# Patient Record
Sex: Male | Born: 1955 | Race: Black or African American | Hispanic: No | Smoking: Former smoker
Health system: Southern US, Community
[De-identification: ages and names within clinical notes are randomized; demographics above are authoritative.]

## PROBLEM LIST (undated history)

## (undated) DIAGNOSIS — I10 Essential (primary) hypertension: Secondary | ICD-10-CM

## (undated) DIAGNOSIS — N02B9 Other recurrent and persistent immunoglobulin A nephropathy: Secondary | ICD-10-CM

## (undated) DIAGNOSIS — N028 Recurrent and persistent hematuria with other morphologic changes: Secondary | ICD-10-CM

## (undated) DIAGNOSIS — M352 Behcet's disease: Secondary | ICD-10-CM

## (undated) DIAGNOSIS — C801 Malignant (primary) neoplasm, unspecified: Secondary | ICD-10-CM

## (undated) HISTORY — PX: AMPUTATION FINGER: SHX6594

---

## 2007-05-31 ENCOUNTER — Emergency Department: Payer: Self-pay | Admitting: Emergency Medicine

## 2008-10-16 ENCOUNTER — Emergency Department: Payer: Self-pay | Admitting: Emergency Medicine

## 2011-01-20 ENCOUNTER — Emergency Department: Payer: Self-pay | Admitting: Emergency Medicine

## 2011-02-20 ENCOUNTER — Emergency Department: Payer: Self-pay | Admitting: Emergency Medicine

## 2011-03-20 ENCOUNTER — Emergency Department: Payer: Self-pay | Admitting: Emergency Medicine

## 2011-10-18 ENCOUNTER — Emergency Department: Payer: Self-pay | Admitting: Emergency Medicine

## 2011-10-28 ENCOUNTER — Emergency Department: Payer: Self-pay | Admitting: Emergency Medicine

## 2012-03-21 ENCOUNTER — Emergency Department: Payer: Self-pay | Admitting: Emergency Medicine

## 2012-05-16 ENCOUNTER — Emergency Department: Payer: Self-pay | Admitting: Emergency Medicine

## 2012-05-17 LAB — COMPREHENSIVE METABOLIC PANEL
Albumin: 3.2 g/dL — ABNORMAL LOW (ref 3.4–5.0)
Alkaline Phosphatase: 59 U/L (ref 50–136)
BUN: 14 mg/dL (ref 7–18)
Bilirubin,Total: 0.2 mg/dL (ref 0.2–1.0)
Calcium, Total: 8.9 mg/dL (ref 8.5–10.1)
Chloride: 107 mmol/L (ref 98–107)
Creatinine: 0.74 mg/dL (ref 0.60–1.30)
EGFR (Non-African Amer.): >60
Osmolality: 281 (ref 275–301)
Potassium: 3.4 mmol/L — ABNORMAL LOW (ref 3.5–5.1)
Sodium: 141 mmol/L (ref 136–145)
Total Protein: 7 g/dL (ref 6.4–8.2)

## 2012-05-17 LAB — URINALYSIS, COMPLETE
Bilirubin,UR: NEGATIVE
Blood: NEGATIVE
Ketone: NEGATIVE
Nitrite: NEGATIVE
Protein: NEGATIVE
Specific Gravity: 1.017 (ref 1.003–1.030)
Squamous Epithelial: NONE SEEN
WBC UR: 1 /HPF (ref 0–5)

## 2012-05-17 LAB — CBC
HGB: 13.9 g/dL (ref 13.0–18.0)
MCH: 32.1 pg (ref 26.0–34.0)
MCHC: 33.5 g/dL (ref 32.0–36.0)
RDW: 14.4 % (ref 11.5–14.5)

## 2012-05-17 LAB — TSH: Thyroid Stimulating Horm: 0.97 u[IU]/mL

## 2012-05-17 LAB — DRUG SCREEN, URINE
Amphetamines, Ur Screen: NEGATIVE (ref ?–1000)
Cannabinoid 50 Ng, Ur ~~LOC~~: NEGATIVE (ref ?–50)
MDMA (Ecstasy)Ur Screen: NEGATIVE (ref ?–500)
Methadone, Ur Screen: NEGATIVE (ref ?–300)
Opiate, Ur Screen: NEGATIVE (ref ?–300)
Phencyclidine (PCP) Ur S: NEGATIVE (ref ?–25)
Tricyclic, Ur Screen: NEGATIVE (ref ?–1000)

## 2012-05-17 LAB — CK TOTAL AND CKMB (NOT AT ARMC): CK-MB: 2.5 ng/mL (ref 0.5–3.6)

## 2012-05-17 LAB — ETHANOL: Ethanol %: <0.003 % (ref 0.000–0.080)

## 2014-05-02 ENCOUNTER — Observation Stay: Payer: Self-pay | Admitting: Internal Medicine

## 2014-05-02 LAB — CBC WITH DIFFERENTIAL/PLATELET
BASOS PCT: 0.9 %
Basophil #: 0.1 10*3/uL (ref 0.0–0.1)
Eosinophil #: 0.2 10*3/uL (ref 0.0–0.7)
Eosinophil %: 2.7 %
HCT: 44.3 % (ref 40.0–52.0)
HGB: 14.4 g/dL (ref 13.0–18.0)
LYMPHS PCT: 31.6 %
Lymphocyte #: 2.3 10*3/uL (ref 1.0–3.6)
MCH: 31.1 pg (ref 26.0–34.0)
MCHC: 32.5 g/dL (ref 32.0–36.0)
MCV: 96 fL (ref 80–100)
Monocyte #: 0.7 x10 3/mm (ref 0.2–1.0)
Monocyte %: 9.4 %
NEUTROS ABS: 4 10*3/uL (ref 1.4–6.5)
Neutrophil %: 55.4 %
Platelet: 376 10*3/uL (ref 150–440)
RBC: 4.62 10*6/uL (ref 4.40–5.90)
RDW: 14.1 % (ref 11.5–14.5)
WBC: 7.3 10*3/uL (ref 3.8–10.6)

## 2014-05-02 LAB — BASIC METABOLIC PANEL
Anion Gap: 6 — ABNORMAL LOW (ref 7–16)
BUN: 15 mg/dL (ref 7–18)
CALCIUM: 8.3 mg/dL — AB (ref 8.5–10.1)
CREATININE: 1.1 mg/dL (ref 0.60–1.30)
Chloride: 109 mmol/L — ABNORMAL HIGH (ref 98–107)
Co2: 26 mmol/L (ref 21–32)
EGFR (Non-African Amer.): >60
Glucose: 94 mg/dL (ref 65–99)
Osmolality: 282 (ref 275–301)
POTASSIUM: 3.8 mmol/L (ref 3.5–5.1)
SODIUM: 141 mmol/L (ref 136–145)

## 2014-05-02 LAB — PROTIME-INR
INR: 1
PROTHROMBIN TIME: 12.9 s (ref 11.5–14.7)

## 2014-05-07 LAB — CULTURE, BLOOD (SINGLE)

## 2014-08-02 ENCOUNTER — Encounter
Admit: 2014-08-02 | Disposition: A | Discharge status: Home / Self Care | Payer: Self-pay | Attending: Surgery | Admitting: Surgery

## 2014-08-13 NOTE — Discharge Summary (Signed)
PATIENT NAME:  Victor Wilkerson, Victor Wilkerson MR#:  [id number] DATE OF BIRTH:  May 04, 1955  DATE OF ADMISSION:  05/02/2014 DATE OF DISCHARGE:  05/03/2014  PRIMARY CARE PHYSICIAN: Marguerita Merles, MD   DISCHARGE DIAGNOSES: 1. 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 2. Hypertension.  3. Wegener disease.  4. Tobacco abuse.   CONDITION: Stable.   CODE STATUS: Full code.   HOME MEDICATIONS: Please refer to the medication reconciliation list.   DIET: Low-sodium diet.   ACTIVITY: As tolerated.   FOLLOWUP CARE: Follow up with PCP within 1-2 weeks.   REASON FOR ADMISSION: Toe pain.   HOSPITAL COURSE: The patient is a 59 year old African American gentleman with a history of Buerger disease and hypertension, who is in the ED with a left second toe pain for the past 2-3 days. For a detailed history and physical examination, please refer to the admission note dictated by Dr. Lavetta Nielsen. On admission date, the patient's laboratory data was unremarkable. Chest x-ray of the left foot reveals no acute abnormality. After admission, the patient got pain control with oxycodone. After admission, the patient had been treated with pain medication p.r.n.  Left second toe pain is better. The patient has no signs of infection or cellulitis. The patient has no complaints. Vital signs are stable. He is clinically stable and will be discharged to home today. I discussed the patient's discharge plan with the patient, nurse, and case manager.   TIME SPENT: About 35 minutes.    ____________________________ Demetrios Loll, MD qc:mw D: 05/03/2014 12:56:15 ET T: 05/03/2014 14:02:22 ET JOB#: [id number]  cc: Demetrios Loll, MD, <Dictator> Demetrios Loll MD ELECTRONICALLY SIGNED 05/03/2014 16:56

## 2014-08-13 NOTE — H&P (Signed)
PATIENT NAME:  Victor Wilkerson, Victor Wilkerson MR#:  [id number] DATE OF BIRTH:  10-20-1955  DATE OF ADMISSION:  05/02/2014  REFERRING PHYSICIAN: Luanna Cole. Ruffian, III, PA-C  PRIMARY CARE PHYSICIAN: Dr. Jannifer Franklin    CHIEF COMPLAINT: Toe pain.    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 Describes pain of the left second digit, which is worsening over the last 2 to 3 days' duration, describes it only as "pain," 10/10 intensity, no worsening or relieving factors, associated edema, erythema of the area and also an ulceration of the toe on the dorsal surface with some serosanguineous discharge. Denies any fevers, chills, or purulent drainage. Of note, ran out of pain medications about 4 days prior to arrival to the hospital.   REVIEW OF SYSTEMS:   CONSTITUTIONAL: Denies fevers, chills. Positive for fatigue, weakness.  EYES: Denies blurred vision, double vision, eye pain.  EARS, NOSE, THROAT: Denies tinnitus, ear pain, hearing loss.  RESPIRATORY:  Denies cough, wheeze, shortness of breath.    CARDIOVASCULAR: Denies chest pain, palpitations, edema.  GASTROINTESTINAL: Denies nausea, vomiting, diarrhea, abdominal pain.  GENITOURINARY: Denies dysuria or hematuria.  ENDOCRINE: Denies nocturia or thyroid problems. HEMATOLOGIC AND LYMPHATIC:  Denies easy bruising or bleeding.  SKIN: Denies rash, lesions aside from ulceration of the toe as described above.   MUSCULOSKELETAL: Denies pain in neck, back, shoulder, knees, hips or arthritic symptoms.  NEUROLOGIC: Denies paralysis, paresthesias.  PSYCHIATRIC: Denies anxiety or depressive symptoms.  Otherwise full review of systems performed by me is negative.     PAST MEDICAL HISTORY: Significant for hypertension as well as Wegener's and tobacco usage.   SOCIAL HISTORY: Positive  for everyday tobacco use. Denies any alcohol or drug use. Previously did use crack cocaine.   FAMILY HISTORY:  Denies any known cardiovascular or pulmonary disorders.   ALLERGIES: No known drug allergies.   HOME MEDICATIONS: Include aspirin 81 mg p.o. daily, oxycodone 10 mg p.o. q. 6 hours as needed for pain, clonidine 0.2 mg p.o. daily, hydrochlorothiazide/lisinopril 12.5/20 mg p.o. daily, amlodipine 5 mg p.o. daily, hydralazine 25 mg p.o. b.i.d.   PHYSICAL EXAMINATION:  VITAL SIGNS: Temperature 97.8, heart rate 70, respirations 18,  currently 158/85, saturating 98% room air, weight 61.2 kg, BMI of 21.8.  GENERAL:  African-American gentleman currently in no acute distress.  HEAD: Normocephalic, atraumatic.  EYES: Pupils equal, round, reactive to light. Extraocular muscles intact. No scleral icterus.  MOUTH: Moist mucosal membrane. Dentition poor. No abscess noted.  EAR, NOSE, THROAT: Clear without exudates. No external lesions.  NECK: Supple. No thyromegaly. No nodules. No JVD.  PULMONARY: Clear to auscultation bilaterally without wheeze, rales or rhonchi. No use of accessory muscles. Good respiratory effort.  CHEST: Nontender to palpation.  CARDIOVASCULAR: S1, S2, regular rate and rhythm. No murmurs, rubs or gallops. No edema. Pedal pulses diminished bilaterally. GASTROINTESTINAL: Soft, nontender, nondistended. No masses. Positive bowel sounds. No hepatosplenomegaly.  MUSCULOSKELETAL: No swelling, clubbing, or edema. Range of motion full in all extremities.  NEUROLOGIC: Cranial nerves II through XII intact. No gross focal neurological deficits. Sensation intact. Reflexes intact.  SKIN: Over the second left toe there is an ulceration on the dorsal surface about 1 x 1 cm with some serosanguineous discharge, nonpurulent discharge noted with some scant surrounding erythema.   PSYCHIATRIC: Mood and affect within normal limits. The patient is awake,  alert and oriented x 3. Insight, judgment  intact.   LABORATORY DATA: Sodium 141, potassium 3.8, chloride 109, bicarbonate 26, BUN 15, creatinine 1.10, glucose 94, WBC 7.3, hemoglobin 14.4, platelets of 376. X-ray of the left foot reveals no acute abnormality.   ASSESSMENT AND PLAN: A 59 year old African-American gentleman with history of Wegener's and hypertension presenting with left-sided toe pain unrelieved despite receiving medications.  1.  Intractable pain secondary to buegers and ulceration.  Provide with pain medications.  case discused vascular surgery in the Emergency Department. No acute intervention required.  Provide pain medications as required as well as bowel regimen. Continue calcium channel blockers.  2.  Hypertension.  Continue with calcium channel blocker, clonidine, Norvasc, hydralazine.   3.  Venous thromboembolic prophylaxis  with heparin subcutaneous.    CODE STATUS: The patient is full code.    TIME SPENT:  35 minutes.   ____________________________ Aaron Mose. Jemimah Cressy, MD dkh:AT D: 05/03/2014 00:02:19 ET T: 05/03/2014 00:35:22 ET JOB#: [id number]  cc: Aaron Mose. Zandra Lajeunesse, MD, <Dictator> Emanuella Nickle Woodfin Ganja MD ELECTRONICALLY SIGNED 05/03/2014 3:33

## 2014-08-15 ENCOUNTER — Encounter: Payer: Self-pay | Admitting: *Deleted

## 2014-08-15 ENCOUNTER — Emergency Department
Admission: EM | Admit: 2014-08-15 | Discharge: 2014-08-16 | Disposition: A | Discharge status: Home / Self Care | Payer: Medicaid Other | Attending: Emergency Medicine | Admitting: Emergency Medicine

## 2014-08-15 DIAGNOSIS — M79672 Pain in left foot: Secondary | ICD-10-CM | POA: Diagnosis present

## 2014-08-15 DIAGNOSIS — L03032 Cellulitis of left toe: Secondary | ICD-10-CM | POA: Diagnosis not present

## 2014-08-15 DIAGNOSIS — I10 Essential (primary) hypertension: Secondary | ICD-10-CM | POA: Diagnosis not present

## 2014-08-15 DIAGNOSIS — Z87891 Personal history of nicotine dependence: Secondary | ICD-10-CM | POA: Diagnosis not present

## 2014-08-15 HISTORY — DX: Other recurrent and persistent immunoglobulin A nephropathy: N02.B9

## 2014-08-15 HISTORY — DX: Recurrent and persistent hematuria with other morphologic changes: N02.8

## 2014-08-15 HISTORY — DX: Essential (primary) hypertension: I10

## 2014-08-15 NOTE — ED Notes (Signed)
Pt states wound to left foot for over three months. Pt states he has more severe pain in this area and wants to get the wound looked at.

## 2014-08-16 ENCOUNTER — Emergency Department: Payer: Medicaid Other

## 2014-08-16 ENCOUNTER — Encounter: Payer: Self-pay | Admitting: Emergency Medicine

## 2014-08-16 MED ORDER — CEPHALEXIN 500 MG PO CAPS: 500.0000 mg | ORAL_CAPSULE | Freq: Three times a day (TID) | ORAL | Status: AC

## 2014-08-16 MED ORDER — OXYCODONE-ACETAMINOPHEN 5-325 MG PO TABS
2.0000 | ORAL_TABLET | Freq: Once | ORAL | Status: AC
  Administered 2014-08-16: 2 via ORAL

## 2014-08-16 MED ORDER — HYDROCODONE-ACETAMINOPHEN 5-325 MG PO TABS: 2.0000 | ORAL_TABLET | Freq: Four times a day (QID) | ORAL | Status: DC | PRN

## 2014-08-16 MED ORDER — OXYCODONE-ACETAMINOPHEN 5-325 MG PO TABS
ORAL_TABLET | ORAL | Status: AC
  Filled 2014-08-16: qty 2

## 2014-08-16 NOTE — ED Provider Notes (Signed)
[facility] Emergency Department Provider Note  Time seen: 1:05 AM  I have reviewed the triage vital signs and the nursing notes.   HISTORY  Chief Complaint Foot Pain and Wound Check    HPI Victor Wilkerson is a 59 y.o. male presents to the emergency department with left foot pain. According to the patient has had a wound on his left second toe for approximately 3 months. States pain has worsened over last several days. Patient is currently being treated by the wound clinic for this ulcer. Patient has had several amputations of his hand for similar wounds per the patient. Has a history of peripheral artery disease. Patient states the pain is constant, dull, 10 out of 10. Denies any chest pain, shortness of breath, fever, abdominal pain, nausea, vomiting. Pain is worse with movement.     Past Medical History  Diagnosis Date  . Hypertension   . Berger's disease   . Buerger disease     There are no active problems to display for this patient.   Past Surgical History  Procedure Laterality Date  . Amputation finger Bilateral     RIGHT THIRD DIGIT AND LEFT FOUTH DIGIT    No current outpatient prescriptions on file.  Allergies Review of patient's allergies indicates no known allergies.  History reviewed. No pertinent family history.  Social History History  Substance Use Topics  . Smoking status: Former Research scientist (life sciences)  . Smokeless tobacco: Not on file  . Alcohol Use: No    Review of Systems Constitutional: Negative for fever. Cardiovascular: Negative for chest pain. Respiratory: Negative for shortness of breath. Gastrointestinal: Negative for abdominal pain, vomiting and diarrhea.  10-point ROS otherwise negative.  ____________________________________________   PHYSICAL EXAM:  VITAL SIGNS: ED Triage Vitals  Enc Vitals Group     BP 08/15/14 2237 192/105 mmHg     Pulse Rate 08/15/14 2237 64     Resp 08/15/14 2237 18     Temp 08/15/14 2237 98.3  F (36.8 C)     Temp Source 08/15/14 2237 Oral     SpO2 08/15/14 2237 99 %     Weight 08/15/14 2237 137 lb (62.143 kg)     Height 08/15/14 2237 '5\' 6"'$  (1.676 m)     Head Cir --      Peak Flow --      Pain Score 08/15/14 2238 9     Pain Loc --      Pain Edu? --      Excl. in [place]? --     Constitutional: Alert and oriented. Well appearing and in no distress. Cardiovascular: Normal rate, regular rhythm. No murmurs, rubs, or gallops. Respiratory: Normal respiratory effort without tachypnea nor retractions. Breath sounds are clear  Gastrointestinal: Soft and nontender. No distention.  There is no CVA tenderness. Musculoskeletal: Small wound/ulcer to left second toe. Mild to moderate swelling of the toe and distal foot. Moderate tenderness to palpation. Neurologic:  Normal speech and language. States decreased sensation in bilateral lower extremities which is chronic. Skin:  Skin is warm, Psychiatric: Mood and affect are normal.   ____________________________________________    RADIOLOGY  Negative for osteomyelitis  ____________________________________________    INITIAL IMPRESSION / ASSESSMENT AND PLAN / ED COURSE  Pertinent labs & imaging results that were available during my care of the patient were reviewed by me and considered in my medical decision making (see chart for details).  59 year old male with peripheral artery disease presents for recheck of a wound to his  left second toe. We'll x-ray to evaluate for osteomyelitis. We'll treat pain while awaiting x-ray results. ----------------------------------------- 2:17 AM on 08/16/2014 -----------------------------------------  X-ray negative for osteomyelitis. Discussed with patient will cover with Keflex and have the patient continue to follow up with the wound care center.   FINAL CLINICAL IMPRESSION(S) / ED DIAGNOSES  Left 2nd toe wound Cellulitis   Harvest Dark, MD 08/16/14 [phone]

## 2014-08-16 NOTE — Discharge Instructions (Signed)

## 2014-08-16 NOTE — ED Notes (Addendum)
PT BACK FROM XRAY

## 2014-08-16 NOTE — ED Notes (Signed)
Patient transported to X-ray 

## 2014-08-16 NOTE — ED Notes (Signed)
Pt verbalizes understanding of discharge instructions.

## 2015-08-10 ENCOUNTER — Emergency Department: Payer: Medicaid Other

## 2015-08-10 ENCOUNTER — Emergency Department
Admission: EM | Admit: 2015-08-10 | Discharge: 2015-08-11 | Disposition: A | Discharge status: Home / Self Care | Payer: Medicaid Other | Attending: Emergency Medicine | Admitting: Emergency Medicine

## 2015-08-10 DIAGNOSIS — R079 Chest pain, unspecified: Secondary | ICD-10-CM

## 2015-08-10 DIAGNOSIS — R918 Other nonspecific abnormal finding of lung field: Secondary | ICD-10-CM | POA: Diagnosis not present

## 2015-08-10 DIAGNOSIS — R0789 Other chest pain: Secondary | ICD-10-CM | POA: Diagnosis present

## 2015-08-10 DIAGNOSIS — Z87891 Personal history of nicotine dependence: Secondary | ICD-10-CM | POA: Diagnosis not present

## 2015-08-10 DIAGNOSIS — Z79899 Other long term (current) drug therapy: Secondary | ICD-10-CM | POA: Diagnosis not present

## 2015-08-10 DIAGNOSIS — I10 Essential (primary) hypertension: Secondary | ICD-10-CM | POA: Insufficient documentation

## 2015-08-10 DIAGNOSIS — M352 Behcet's disease: Secondary | ICD-10-CM | POA: Diagnosis not present

## 2015-08-10 HISTORY — DX: Behcet's disease: M35.2

## 2015-08-10 LAB — CBC
HCT: 36.5 % — ABNORMAL LOW (ref 40.0–52.0)
Hemoglobin: 12 g/dL — ABNORMAL LOW (ref 13.0–18.0)
MCH: 29.4 pg (ref 26.0–34.0)
MCHC: 32.9 g/dL (ref 32.0–36.0)
MCV: 89.5 fL (ref 80.0–100.0)
Platelets: 548 K/uL — ABNORMAL HIGH (ref 150–440)
RBC: 4.08 MIL/uL — ABNORMAL LOW (ref 4.40–5.90)
RDW: 13.2 % (ref 11.5–14.5)
WBC: 15.3 K/uL — ABNORMAL HIGH (ref 3.8–10.6)

## 2015-08-10 LAB — BASIC METABOLIC PANEL
ANION GAP: 10 (ref 5–15)
BUN: 14 mg/dL (ref 6–20)
CALCIUM: 8.9 mg/dL (ref 8.9–10.3)
CO2: 23 mmol/L (ref 22–32)
Chloride: 104 mmol/L (ref 101–111)
Creatinine, Ser: 0.97 mg/dL (ref 0.61–1.24)
Glucose, Bld: 118 mg/dL — ABNORMAL HIGH (ref 65–99)
Potassium: 2.8 mmol/L — CL (ref 3.5–5.1)
Sodium: 137 mmol/L (ref 135–145)

## 2015-08-10 LAB — TROPONIN I: Troponin I: 0.04 ng/mL — ABNORMAL HIGH

## 2015-08-10 MED ORDER — MORPHINE SULFATE (PF) 4 MG/ML IV SOLN
4.0000 mg | Freq: Once | INTRAVENOUS | Status: AC
  Administered 2015-08-11: 4 mg via INTRAVENOUS
  Filled 2015-08-10: qty 1

## 2015-08-10 MED ORDER — SODIUM CHLORIDE 0.9 % IV BOLUS (SEPSIS)
1000.0000 mL | Freq: Once | INTRAVENOUS | Status: AC
  Administered 2015-08-11: 1000 mL via INTRAVENOUS

## 2015-08-10 MED ORDER — ONDANSETRON HCL 4 MG/2ML IJ SOLN
4.0000 mg | Freq: Once | INTRAMUSCULAR | Status: AC
  Administered 2015-08-11: 4 mg via INTRAVENOUS
  Filled 2015-08-10: qty 2

## 2015-08-10 NOTE — ED Notes (Signed)
PT arrives to ER via POV; ambulatory c/o right sided CP X 1 week, cough and right hand pain. Pt alert and oriented X4, active, cooperative, pt in NAD. RR even and unlabored, color WNL.

## 2015-08-10 NOTE — ED Notes (Signed)
Potassium and Troponin results called to this RN. K+ 2.8 and Troponin 0.04 - MD made aware.

## 2015-08-11 ENCOUNTER — Emergency Department: Payer: Medicaid Other

## 2015-08-11 LAB — TROPONIN I: Troponin I: 0.03 ng/mL (ref <0.031)

## 2015-08-11 MED ORDER — POTASSIUM CHLORIDE CRYS ER 20 MEQ PO TBCR
40.0000 meq | EXTENDED_RELEASE_TABLET | Freq: Once | ORAL | Status: AC
  Administered 2015-08-11: 40 meq via ORAL
  Filled 2015-08-11: qty 2

## 2015-08-11 MED ORDER — OXYCODONE-ACETAMINOPHEN 5-325 MG PO TABS: 1.0000 | ORAL_TABLET | Freq: Four times a day (QID) | ORAL | Status: AC | PRN

## 2015-08-11 MED ORDER — IOPAMIDOL (ISOVUE-370) INJECTION 76%
75.0000 mL | Freq: Once | INTRAVENOUS | Status: AC | PRN
  Administered 2015-08-11: 75 mL via INTRAVENOUS

## 2015-08-11 NOTE — ED Notes (Signed)

## 2015-08-11 NOTE — ED Notes (Signed)
Left hand pain and swelling, hx of Behcet's disease.

## 2015-08-11 NOTE — ED Provider Notes (Signed)
[facility] Emergency Department Provider Note  ____________________________________________  Time seen: Approximately 6962 PM  I have reviewed the triage vital signs and the nursing notes.   HISTORY  Chief Complaint Chest Pain and Hand Pain   HPI Victor Wilkerson is a 60 y.o. male with a history of hypertension and Berger's disease was presenting to the emergency department today with right-sided chest pain over the past week. He says that the pain is minimal when he is at rest but severe with cough and deep breathing. He says that he feels like he needs to cough something up but is not able to cough anything up. He says that the pain is not worsened with exertion and not associated with shortness of breath. He does not report any diaphoresis. Denies any nausea or vomiting. Says that the pain feels like pressure type pain to the right side of his chest. Also with right hand pain but says this is chronic from his Berger's disease.  He says that he has been smoking since he is about 60 years old none pack of cigarettes at this time lasting about 5 days.   Past Medical History  Diagnosis Date  . Hypertension   . Berger's disease   . Behcet's disease ([place])     There are no active problems to display for this patient.   Past Surgical History  Procedure Laterality Date  . Amputation finger Bilateral     RIGHT THIRD DIGIT AND LEFT FOUTH DIGIT    Current Outpatient Rx  Name  Route  Sig  Dispense  Refill  . HYDROcodone-acetaminophen (NORCO) 5-325 MG per tablet   Oral   Take 2 tablets by mouth every 6 (six) hours as needed for moderate pain.   12 tablet   0     Allergies Review of patient's allergies indicates no known allergies.  No family history on file.  Social History Social History  Substance Use Topics  . Smoking status: Former Research scientist (life sciences)  . Smokeless tobacco: None  . Alcohol Use: No    Review of Systems Constitutional: No fever/chills Eyes:  No visual changes. ENT: No sore throat. Cardiovascular:As above  Respiratory: Denies shortness of breath. Gastrointestinal: No abdominal pain.  No nausea, no vomiting.  No diarrhea.  No constipation. Genitourinary: Negative for dysuria. Musculoskeletal: Negative for back pain. Skin: Negative for rash. Neurological: Negative for headaches, focal weakness or numbness.  10-point ROS otherwise negative.  ____________________________________________   PHYSICAL EXAM:  VITAL SIGNS: ED Triage Vitals  Enc Vitals Group     BP 08/10/15 2207 155/88 mmHg     Pulse Rate 08/10/15 2207 80     Resp 08/10/15 2207 16     Temp 08/10/15 2207 98.9 F (37.2 C)     Temp Source 08/10/15 2207 Oral     SpO2 08/10/15 2207 100 %     Weight 08/10/15 2207 138 lb (62.596 kg)     Height 08/10/15 2207 '5\' 7"'$  (1.702 m)     Head Cir --      Peak Flow --      Pain Score 08/10/15 2207 6     Pain Loc --      Pain Edu? --      Excl. in [place]? --     Constitutional: Alert and oriented. Well appearing and in no acute distress. Eyes: Conjunctivae are normal. PERRL. EOMI. Head: Atraumatic. Nose: No congestion/rhinnorhea. Mouth/Throat: Mucous membranes are moist.   Neck: No stridor.   Cardiovascular: Normal rate,  regular rhythm. Grossly normal heart sounds.  Good peripheral circulation. Respiratory: Normal respiratory effort.  No retractions. Lungs CTAB. Gastrointestinal: Soft and nontender. No distention. No CVA tenderness. Musculoskeletal: No lower extremity tenderness nor edema.  No joint effusions. Neurologic:  Normal speech and language. No gross focal neurologic deficits are appreciated.  Skin:  Skin is warm, dry and intact. No rash noted. Psychiatric: Mood and affect are normal. Speech and behavior are normal.  ____________________________________________   LABS (all labs ordered are listed, but only abnormal results are displayed)  Labs Reviewed  BASIC METABOLIC PANEL - Abnormal; Notable for the  following:    Potassium 2.8 (*)    Glucose, Bld 118 (*)    All other components within normal limits  CBC - Abnormal; Notable for the following:    WBC 15.3 (*)    RBC 4.08 (*)    Hemoglobin 12.0 (*)    HCT 36.5 (*)    Platelets 548 (*)    All other components within normal limits  TROPONIN I - Abnormal; Notable for the following:    Troponin I 0.04 (*)    All other components within normal limits   ____________________________________________  EKG  ED ECG REPORT I, Doran Stabler, the attending physician, personally viewed and interpreted this ECG.   Date: 08/11/2015  EKG Time: 2204  Rate: 76  Rhythm: normal sinus rhythm  Axis: Normal axis  Intervals:none  ST&T Change: ST elevation in V1 through V3. T-wave inversions in aVL, V5 and V6. Voltage calibration is double normal level. We'll repeat EKG.  ED ECG REPORT I, Doran Stabler, the attending physician, personally viewed and interpreted this ECG.   Date: 08/11/2015  EKG Time: 2344  Rate: 74  Rhythm: normal sinus rhythm  Axis: Normal axis  Intervals:none  ST&T Change: LVH with secondary repolarization abnormality. ST elevation in V1 through V3. T-wave inversions in V5 and V6 with depression of 1 mm in V5. Morphology appears very similar to the EKG previously done on 05/16/2012.  ED ECG REPORT I, Doran Stabler, the attending physician, personally viewed and interpreted this ECG.   Date: 08/11/2015  EKG Time: 2349  Rate: 74  Rhythm: normal sinus rhythm  Axis: Normal  Intervals:none  ST&T Change: ST elevation in V1 through V3. T-wave inversions unchanged from EKG of 2344.    ____________________________________________  RADIOLOGY     DG Chest 2 View (Final result) Result time: 08/10/15 22:24:17   Final result by Rad Results In Interface (08/10/15 22:24:17)   Narrative:   CLINICAL DATA: Chest pain  EXAM: CHEST 2 VIEW  COMPARISON: 01/20/2011  FINDINGS: There are 2 rounded masses  in the right lung, the largest measuring up to 7 cm.  Chronic cardiomegaly and aortic tortuosity. There is no edema, consolidation, effusion, or pneumothorax.  IMPRESSION: Right lung masses suggesting a malignant process. Recommend chest CT with contrast.   Electronically Signed By: Monte Fantasia M.D. On: 08/10/2015 22:24       CT Angio Chest PE W/Cm &/Or Wo Cm (Final result) Result time: 08/11/15 01:08:55   Final result by Rad Results In Interface (08/11/15 01:08:55)   Narrative:   CLINICAL DATA: Acute onset of right-sided chest pain, cough and right hand pain. Initial encounter.  EXAM: CT ANGIOGRAPHY CHEST WITH CONTRAST  TECHNIQUE: Multidetector CT imaging of the chest was performed using the standard protocol during bolus administration of intravenous contrast. Multiplanar CT image reconstructions and MIPs were obtained to evaluate the vascular anatomy.  CONTRAST: 75 mL of Isovue 370 IV contrast  COMPARISON: Chest radiograph performed 08/10/2015  FINDINGS: There is no evidence of pulmonary embolus.  There is a large 7.7 x 5.5 x 6.3 cm mildly heterogeneous mass posterior to the distal trachea, partially encasing the right mainstem bronchus and carina, with mass effect on the branches of the right main pulmonary artery. This infiltrates vaguely into the mediastinum. There is also a 4.0 x 3.6 x 3.4 cm mildly heterogeneous mass at the posterior aspect of the right middle lobe.  A 1.2 cm right hilar node is noted. Smaller right middle lobe pulmonary nodules are seen, measuring 7 mm, 7 mm and 5 mm in size, concerning for local spread of disease. There is also a 1.0 cm nodule at the left lung base. Minimal right basilar nodular atelectasis is noted.  There is no evidence of significant focal consolidation, pleural effusion or pneumothorax.  Diffuse coronary artery calcifications are seen. No pericardial effusion is identified. Scattered calcification  is noted along the aortic arch and great vessels. No axillary lymphadenopathy is seen. The visualized portions of the thyroid gland are unremarkable in appearance.  The visualized portions of the liver and spleen are unremarkable. The visualized portions of the pancreas, gallbladder, stomach, adrenal glands and kidneys are within normal limits.  There is mild aneurysmal dilatation of the proximal abdominal aorta, measuring 3.2 cm in AP dimension at the level of the diaphragm.  No acute osseous abnormalities are seen.  Review of the MIP images confirms the above findings.  IMPRESSION: 1. No evidence of pulmonary embolus. 2. Large 7.7 x 5.5 x 6.3 cm mildly heterogeneous mass posterior to the distal trachea, partially encasing the right mainstem bronchus and carina, with mild mass-effect on the branches of the right main pulmonary artery. This infiltrates into the mediastinum. Findings are concerning for primary bronchogenic malignancy. Given this appearance, small cell lung cancer is a possibility. 3. 4.0 x 3.6 x 3.4 cm mildly heterogeneous mass at the posterior aspect of the right middle lobe. Additional smaller right middle lobe and left basilar pulmonary nodules seen, concerning for metastatic disease. 4. 1.2 cm right hilar node seen. 5. Diffuse coronary artery calcifications seen. 6. 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 Recommend followup by ultrasound in 3 years. This recommendation follows ACR consensus guidelines: White Paper of the ACR Incidental Findings Committee II on Vascular Findings. Nat[facility] [employer] Coll Radiol 2013; 10:789-794   Electronically Signed By: Garald Balding M.D. On: 08/11/2015 01:08     ____________________________________________   PROCEDURES    ____________________________________________   INITIAL IMPRESSION / ASSESSMENT AND PLAN / ED COURSE  Pertinent  labs & imaging results that were available during my care of the patient were reviewed by me and considered in my medical decision making (see chart for details).  Patient with pain for one week which is very mild when not coughing. No exertional symptoms. Very similar EKG appearance previous EKG done in February 2014. Do not suspect acute STEMI at this time. Chest x-ray showing possible mass in the right lung. Suspect new onset neoplasm versus PE versus pneumonia. Believe that the acute MI read by the machine is likely a machine error  ----------------------------------------- 1:24 AM on 08/11/2015 -----------------------------------------  Discussed the case with the oncology and PE, Jhonnie Garner. She was supported the patient's contact information and will be contacting the patient for follow-up.  ----------------------------------------- 3:35 AM on 08/11/2015 -----------------------------------------  Patient  is resting comfortably at this time. Troponin is trended downward. I updated the patient has to his diagnosis as well as his labs and imaging reports. He knows that he likely has cancer. He knows that he will also need to follow-up with oncology and then again oncology's follow-up information. He says that the phone number that he has on record is working. I will also be giving him the oncology follow-up number on his discharge paperwork. He will also go home with pain medication. Will be discharged home. The patient understands the plan and is willing to comply. ____________________________________________    FINAL CLINICAL IMPRESSION(S) / ED DIAGNOSES  Chest pain. Lung masses.      Orbie Pyo, MD 08/11/15 [phone]

## 2015-08-14 ENCOUNTER — Ambulatory Visit: Payer: Medicaid Other | Admitting: Internal Medicine

## 2015-08-28 ENCOUNTER — Ambulatory Visit: Payer: Medicaid Other | Admitting: Internal Medicine

## 2015-08-29 ENCOUNTER — Telehealth: Payer: Self-pay | Admitting: *Deleted

## 2015-08-30 NOTE — Telephone Encounter (Signed)
Patient with two no show appointments. Contacted brother listed in EMR who reports patient has been receiving care at [facility] for lung finding. Offered care here if it is needed. Brother will call if needed.

## 2015-11-23 ENCOUNTER — Emergency Department: Payer: Medicaid Other

## 2015-11-23 ENCOUNTER — Emergency Department
Admission: EM | Admit: 2015-11-23 | Discharge: 2015-11-23 | Payer: Medicaid Other | Attending: Emergency Medicine | Admitting: Emergency Medicine

## 2015-11-23 DIAGNOSIS — J189 Pneumonia, unspecified organism: Secondary | ICD-10-CM

## 2015-11-23 DIAGNOSIS — I10 Essential (primary) hypertension: Secondary | ICD-10-CM | POA: Diagnosis not present

## 2015-11-23 DIAGNOSIS — Z87891 Personal history of nicotine dependence: Secondary | ICD-10-CM | POA: Insufficient documentation

## 2015-11-23 DIAGNOSIS — R918 Other nonspecific abnormal finding of lung field: Secondary | ICD-10-CM | POA: Diagnosis not present

## 2015-11-23 DIAGNOSIS — R1310 Dysphagia, unspecified: Secondary | ICD-10-CM

## 2015-11-23 DIAGNOSIS — E860 Dehydration: Secondary | ICD-10-CM | POA: Diagnosis not present

## 2015-11-23 DIAGNOSIS — Z791 Long term (current) use of non-steroidal anti-inflammatories (NSAID): Secondary | ICD-10-CM | POA: Insufficient documentation

## 2015-11-23 DIAGNOSIS — C3491 Malignant neoplasm of unspecified part of right bronchus or lung: Secondary | ICD-10-CM | POA: Insufficient documentation

## 2015-11-23 HISTORY — DX: Malignant (primary) neoplasm, unspecified: C80.1

## 2015-11-23 LAB — LACTIC ACID, PLASMA: LACTIC ACID, VENOUS: 0.9 mmol/L (ref 0.5–1.9)

## 2015-11-23 LAB — CBC WITH DIFFERENTIAL/PLATELET
Basophils Absolute: 0 10*3/uL (ref 0–0.1)
Basophils Relative: 0 %
EOS ABS: 0.1 10*3/uL (ref 0–0.7)
Eosinophils Relative: 0 %
HEMATOCRIT: 27.2 % — AB (ref 40.0–52.0)
HEMOGLOBIN: 8.8 g/dL — AB (ref 13.0–18.0)
LYMPHS ABS: 0.2 10*3/uL — AB (ref 1.0–3.6)
Lymphocytes Relative: 2 %
MCH: 25.3 pg — AB (ref 26.0–34.0)
MCHC: 32.5 g/dL (ref 32.0–36.0)
MCV: 77.7 fL — ABNORMAL LOW (ref 80.0–100.0)
Monocytes Absolute: 0.6 10*3/uL (ref 0.2–1.0)
Monocytes Relative: 5 %
NEUTROS ABS: 11.9 10*3/uL — AB (ref 1.4–6.5)
NEUTROS PCT: 93 %
Platelets: 642 10*3/uL — ABNORMAL HIGH (ref 150–440)
RBC: 3.49 MIL/uL — AB (ref 4.40–5.90)
RDW: 16.4 % — ABNORMAL HIGH (ref 11.5–14.5)
WBC: 12.8 10*3/uL — AB (ref 3.8–10.6)

## 2015-11-23 LAB — COMPREHENSIVE METABOLIC PANEL
ALBUMIN: 2.2 g/dL — AB (ref 3.5–5.0)
ALK PHOS: 57 U/L (ref 38–126)
ALT: 10 U/L — AB (ref 17–63)
AST: 16 U/L (ref 15–41)
Anion gap: 8 (ref 5–15)
BILIRUBIN TOTAL: 0.5 mg/dL (ref 0.3–1.2)
BUN: 12 mg/dL (ref 6–20)
CO2: 25 mmol/L (ref 22–32)
Calcium: 8.6 mg/dL — ABNORMAL LOW (ref 8.9–10.3)
Chloride: 105 mmol/L (ref 101–111)
Creatinine, Ser: 0.53 mg/dL — ABNORMAL LOW (ref 0.61–1.24)
GFR calc Af Amer: >60 mL/min (ref >60)
GFR calc non Af Amer: >60 mL/min (ref >60)
GLUCOSE: 103 mg/dL — AB (ref 65–99)
Potassium: 3 mmol/L — ABNORMAL LOW (ref 3.5–5.1)
SODIUM: 138 mmol/L (ref 135–145)
Total Protein: 7 g/dL (ref 6.5–8.1)

## 2015-11-23 LAB — URINALYSIS COMPLETE WITH MICROSCOPIC (ARMC ONLY)
BILIRUBIN URINE: NEGATIVE
GLUCOSE, UA: NEGATIVE mg/dL
Ketones, ur: NEGATIVE mg/dL
Nitrite: NEGATIVE
PH: 5 (ref 5.0–8.0)
Protein, ur: 30 mg/dL — AB
RBC / HPF: NONE SEEN RBC/hpf (ref 0–5)
SPECIFIC GRAVITY, URINE: 1.055 — AB (ref 1.005–1.030)

## 2015-11-23 LAB — TROPONIN I

## 2015-11-23 MED ORDER — HYDRALAZINE HCL 20 MG/ML IJ SOLN
5.0000 mg | Freq: Once | INTRAMUSCULAR | Status: AC
  Administered 2015-11-23: 5 mg via INTRAVENOUS

## 2015-11-23 MED ORDER — MORPHINE SULFATE (PF) 4 MG/ML IV SOLN
4.0000 mg | Freq: Once | INTRAVENOUS | Status: AC
  Administered 2015-11-23: 4 mg via INTRAVENOUS
  Filled 2015-11-23: qty 1

## 2015-11-23 MED ORDER — ONDANSETRON HCL 4 MG/2ML IJ SOLN
4.0000 mg | Freq: Once | INTRAMUSCULAR | Status: AC
  Administered 2015-11-23: 4 mg via INTRAVENOUS
  Filled 2015-11-23: qty 2

## 2015-11-23 MED ORDER — SODIUM CHLORIDE 0.9 % IV SOLN
1000.0000 mL | Freq: Once | INTRAVENOUS | Status: AC
  Administered 2015-11-23: 1000 mL via INTRAVENOUS

## 2015-11-23 MED ORDER — IOPAMIDOL (ISOVUE-300) INJECTION 61%
75.0000 mL | Freq: Once | INTRAVENOUS | Status: AC | PRN
  Administered 2015-11-23: 75 mL via INTRAVENOUS

## 2015-11-23 MED ORDER — LABETALOL HCL 5 MG/ML IV SOLN
10.0000 mg | Freq: Once | INTRAVENOUS | Status: AC
  Administered 2015-11-23: 10 mg via INTRAVENOUS

## 2015-11-23 MED ORDER — LEVOFLOXACIN IN D5W 750 MG/150ML IV SOLN
750.0000 mg | Freq: Once | INTRAVENOUS | Status: AC
  Administered 2015-11-23: 750 mg via INTRAVENOUS
  Filled 2015-11-23: qty 150

## 2015-11-23 MED ORDER — MORPHINE SULFATE (PF) 4 MG/ML IV SOLN
INTRAVENOUS | Status: AC
  Filled 2015-11-23: qty 1

## 2015-11-23 MED ORDER — KCL IN DEXTROSE-NACL 40-5-0.45 MEQ/L-%-% IV SOLN
INTRAVENOUS | Status: DC
  Administered 2015-11-23: 06:00:00 via INTRAVENOUS
  Filled 2015-11-23 (×2): qty 1000

## 2015-11-23 MED ORDER — MORPHINE SULFATE (PF) 4 MG/ML IV SOLN
4.0000 mg | Freq: Once | INTRAVENOUS | Status: AC
Start: 2015-11-23 — End: 2015-11-23
  Administered 2015-11-23: 4 mg via INTRAVENOUS
  Filled 2015-11-23: qty 1

## 2015-11-23 MED ORDER — LABETALOL HCL 5 MG/ML IV SOLN
INTRAVENOUS | Status: AC
  Administered 2015-11-23: 10 mg via INTRAVENOUS
  Filled 2015-11-23: qty 4

## 2015-11-23 MED ORDER — HYDRALAZINE HCL 20 MG/ML IJ SOLN
INTRAMUSCULAR | Status: AC
  Filled 2015-11-23: qty 1

## 2015-11-23 MED ORDER — MORPHINE SULFATE (PF) 4 MG/ML IV SOLN
4.0000 mg | Freq: Once | INTRAVENOUS | Status: AC
  Administered 2015-11-23: 4 mg via INTRAVENOUS

## 2015-11-23 NOTE — ED Provider Notes (Signed)
[facility] Emergency Department Provider Note        Time seen: ----------------------------------------- 12:56 AM on 11/23/2015 -----------------------------------------    I have reviewed the triage vital signs and the nursing notes.   HISTORY  Chief Complaint No chief complaint on file.    HPI Victor Wilkerson is a 60 y.o. male who presents to ER being brought by EMS for intractable nausea and poor by mouth intake. Reportedly has a history of lung cancer, is currently on radiation therapy but not on chemotherapy. Patient states he cannot keep things down for the last 48 hours. He's not had any diarrhea, is having right-sided chest pain. He denies fevers, states this has not happened to him before. Current cancer treatments at [facility]. He has also been coughing up a lot of phlegm he states.   Past Medical History:  Diagnosis Date  . Behcet's disease ([place])   . Berger's disease   . Hypertension     There are no active problems to display for this patient.   Past Surgical History:  Procedure Laterality Date  . AMPUTATION FINGER Bilateral    RIGHT THIRD DIGIT AND LEFT FOUTH DIGIT    Allergies Review of patient's allergies indicates no known allergies.  Social History Social History  Substance Use Topics  . Smoking status: Former Research scientist (life sciences)  . Smokeless tobacco: Not on file  . Alcohol use No    Review of Systems Constitutional: Negative for fever. Cardiovascular:Positive for chest pain Respiratory: Positive for shortness breath and cough Gastrointestinal: Negative for abdominal pain, positive for nausea and vomiting Genitourinary: Negative for dysuria. Musculoskeletal: Negative for back pain. Skin: Negative for rash. Neurological: Negative for headaches, positive for weakness  10-point ROS otherwise negative.  ____________________________________________   PHYSICAL EXAM:  VITAL SIGNS: ED Triage Vitals  Enc Vitals Group     BP    Pulse      Resp      Temp      Temp src      SpO2      Weight      Height      Head Circumference      Peak Flow      Pain Score      Pain Loc      Pain Edu?      Excl. in [place]?     Constitutional: Alert and oriented. Generally ill-appearing, no distress. Cachexia. Eyes: Conjunctivae are normal. PERRL. Normal extraocular movements. ENT   Head: Normocephalic and atraumatic.   Nose: No congestion/rhinnorhea.   Mouth/Throat: Mucous membranes are dry   Neck: No stridor. Cardiovascular: Normal rate, regular rhythm. No murmurs, rubs, or gallops. Respiratory: Normal respiratory effort without tachypnea nor retractions. Breath sounds are clear and equal bilaterally. No wheezes/rales/rhonchi. Gastrointestinal: Soft and nontender. Normal bowel sounds Musculoskeletal: Nontender with normal range of motion in all extremities. No lower extremity tenderness nor edema. Neurologic:  Normal speech and language. No gross focal neurologic deficits are appreciated.  Skin:  Skin is warm, dry and intact. No rash noted. Psychiatric: Mood and affect are normal. Speech and behavior are normal.  ____________________________________________  EKG: Interpreted by me.Sinus rhythm with a rate of 84 bpm, normal PR interval, normal QT interval. LVH with repolarization  ____________________________________________  ED COURSE:  Pertinent labs & imaging results that were available during my care of the patient were reviewed by me and considered in my medical decision making (see chart for details). Clinical Course  Comment By Time  Received a call  from the radiologist. There is concern that the mass has invaded his esophagus. Earleen Newport, MD 08/11 [phone]  Patient presents for weakness, likely dehydrated. He'll receive IV fluids and antiemetics. We will reassess basic labs and chest x-ray.  Procedures ____________________________________________   LABS (pertinent positives/negatives)  Labs  Reviewed  CBC WITH DIFFERENTIAL/PLATELET - Abnormal; Notable for the following:       Result Value   WBC 12.8 (*)    RBC 3.49 (*)    Hemoglobin 8.8 (*)    HCT 27.2 (*)    MCV 77.7 (*)    MCH 25.3 (*)    RDW 16.4 (*)    Platelets 642 (*)    Neutro Abs 11.9 (*)    Lymphs Abs 0.2 (*)    All other components within normal limits  COMPREHENSIVE METABOLIC PANEL - Abnormal; Notable for the following:    Potassium 3.0 (*)    Glucose, Bld 103 (*)    Creatinine, Ser 0.53 (*)    Calcium 8.6 (*)    Albumin 2.2 (*)    ALT 10 (*)    All other components within normal limits  TROPONIN I  URINALYSIS COMPLETEWITH MICROSCOPIC ([facility] ONLY)  CRITICAL CARE Performed by: Hudson, Majkowski   Total critical care time: 30 minutes  Critical care time was exclusive of separately billable procedures and treating other patients.  Critical care was necessary to treat or prevent imminent or life-threatening deterioration.  Critical care was time spent personally by me on the following activities: development of treatment plan with patient and/or surrogate as well as nursing, discussions with consultants, evaluation of patient's response to treatment, examination of patient, obtaining history from patient or surrogate, ordering and performing treatments and interventions, ordering and review of laboratory studies, ordering and review of radiographic studies, pulse oximetry and re-evaluation of patient's condition.   RADIOLOGY Images were viewed by me  Chest x-ray IMPRESSION: 1. Patchy right-sided airspace opacity raises concern for pneumonia. 2. Known mediastinal and right lower lobe lung masses are less prominent than on the prior studies. CT chest/IMPRESSION: 1. Since the prior study, the patient's large mediastinal mass has partially filled with air and complex material, and appears to encompass the esophagus just below the level of the aortic arch. Air appears to extend beyond the prior  border of the mass at this level. This is suspicious for erosion of the mass through the wall of the esophagus, and extension of ingested contents and air into the mass. The mass currently measures 9.3 x 7.9 x 6.1 cm. 2. 3.0 cm mass at the posterior aspect of the right lower lobe is decreased in size from the prior study. Additional 5 mm right middle lobe nodule is concerning for metastatic disease. 3. New scattered right-sided airspace opacities raise concern for multifocal pneumonia. 4. Diffuse coronary artery calcifications noted. 5. Nonspecific 1.0 cm focus of increased attenuation along the anterior aspect of the liver. 6. 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 Recommend followup by ultrasound in 3 years. This recommendation follows ACR consensus guidelines: White Paper of the ACR Incidental Findings Committee II on Vascular Findings. J Am Coll Radiol 2013; 65:784-696 7. Diffuse soft tissue edema tracking along the mediastinum and along the chest wall. These results were called by telephone at the time of interpretation on 11/23/2015 at 3:32 am to Dr. Lenise Arena, who verbally acknowledged these results. ____________________________________________  FINAL ASSESSMENT AND PLAN  Weakness, vomiting, dehydration, lung cancer,  pneumonia, lung mass infiltrating the esophagus  Plan: Patient with labs and imaging as dictated above. Patient presents not in any acute distress but with persistent difficulty swallowing and inability to swallow. CT findings as dictated above. He has been given IV antibiotics to cover for community-acquired pneumonia. I will discuss with the [facility] oncology service for transfer.  Patient has been accepted in transfer by the [facility] hematology/oncology group. Dr. Harriet Masson has accepted the patient, bed status is pending at this time.  Earleen Newport, MD   Note: This dictation was prepared with Dragon  dictation. Any transcriptional errors that result from this process are unintentional    Earleen Newport, MD 11/23/15 0425

## 2015-11-23 NOTE — ED Notes (Signed)
Resumed care from [place], RN. Pt resting comfortably. When asked if he needs anything, he asked for something to help him rest. Pt offered soothing music and dimming of lights, which he accepted.

## 2015-11-23 NOTE — ED Triage Notes (Signed)
Per EMS: Pt c/o of N/V and weakness beginning Wednesday. Pt was given radiation on Monday. Pt has hx of lung cancer, currently being treated with radiation.

## 2015-11-23 NOTE — ED Notes (Signed)
Pt resting in bed.  States that he does not have nausea at this time.  Pt given pain medication to help with pain level.  No other needs at this time.  Call bell given to patient and instructed to call.  Voiced understanding.  Lights turned down per patient request.

## 2015-12-14 DEATH — deceased

## 2016-11-05 IMAGING — CR DG CHEST 2V
1 series · 2 of 2 positions shown · non-contrast
Comparison: 01/20/2011

CLINICAL DATA: Chest pain

EXAM:
CHEST  2 VIEW

[Series 1: dg chest 2 view · 0.14mm/px · 2 of 2 slices shown]
[im 1/2]
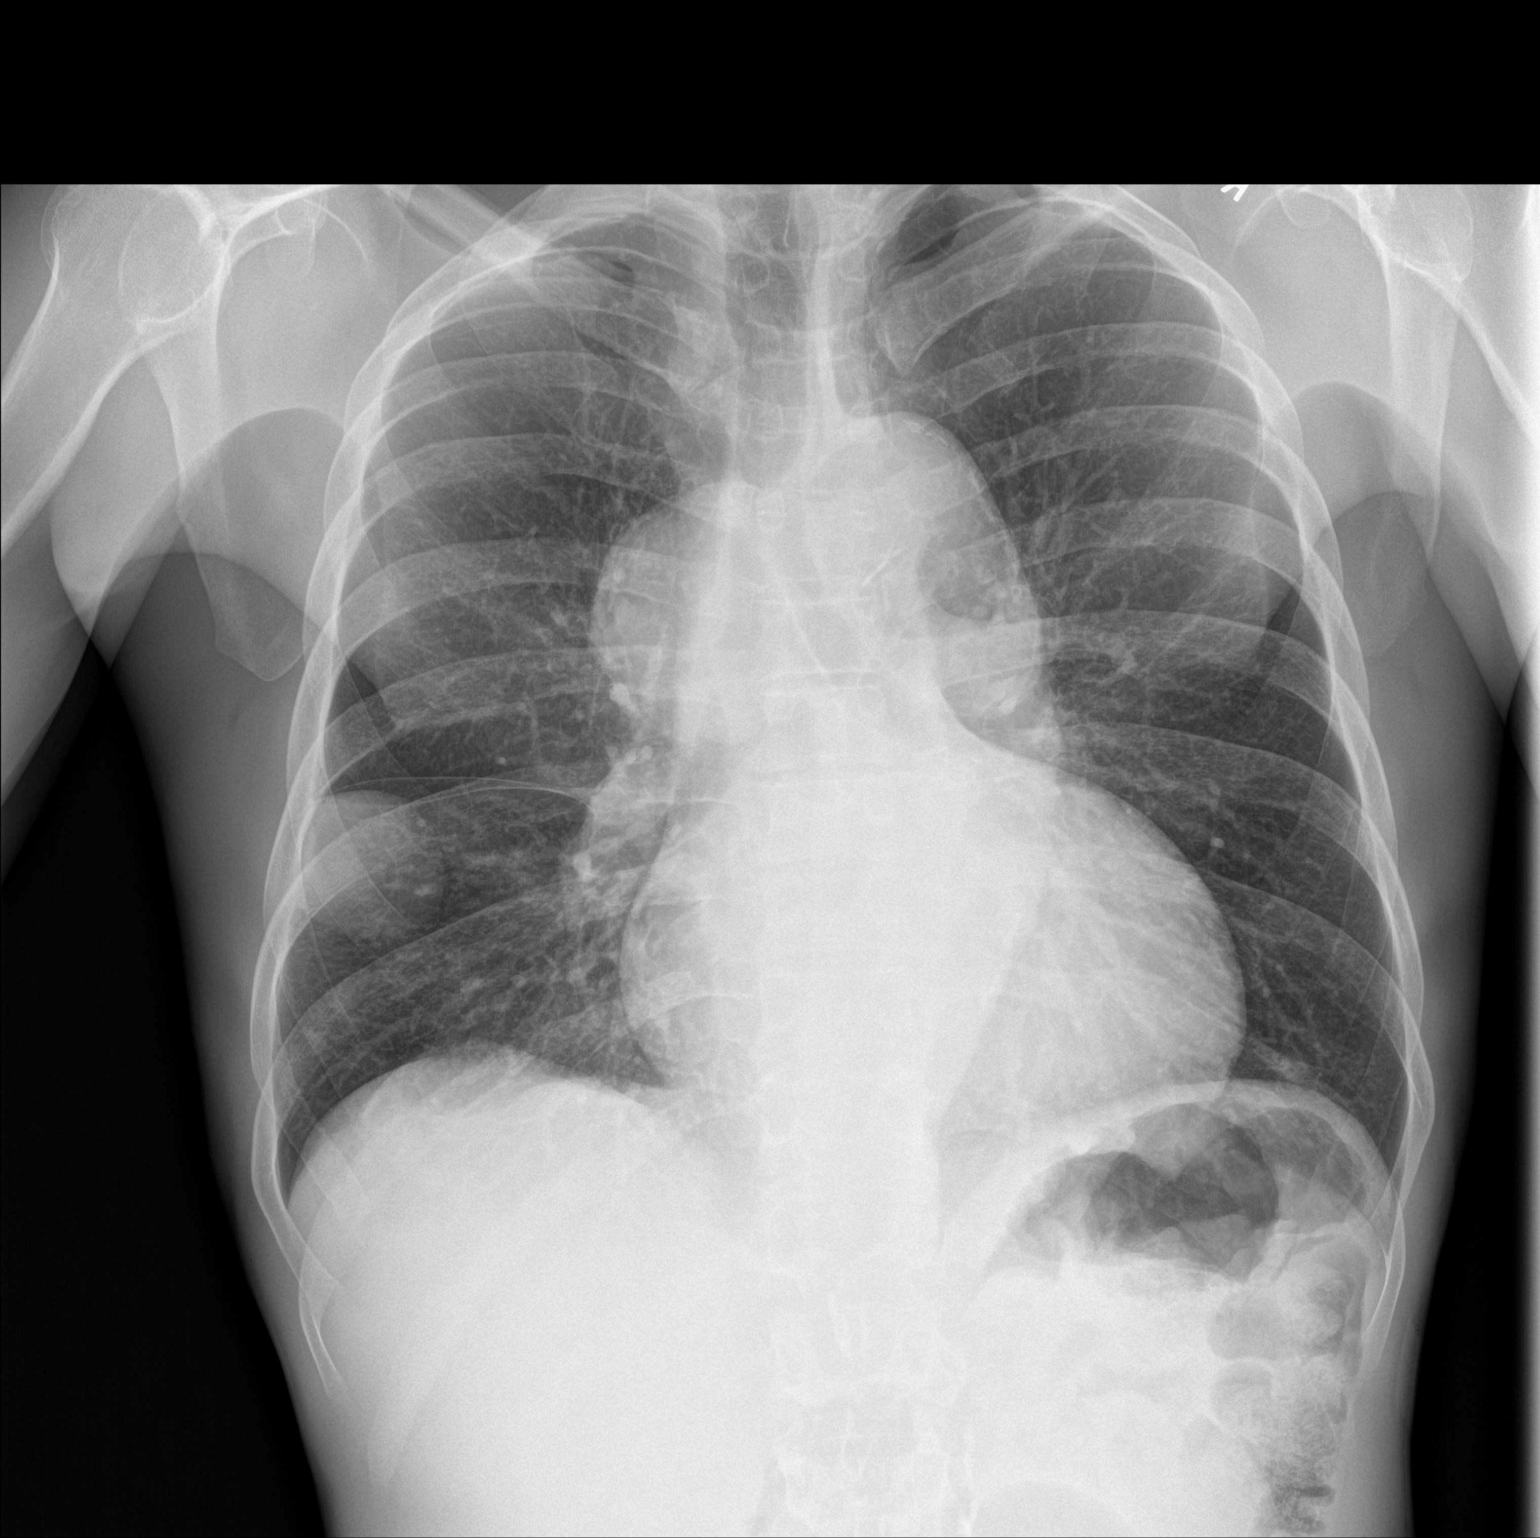
[im 2/2]
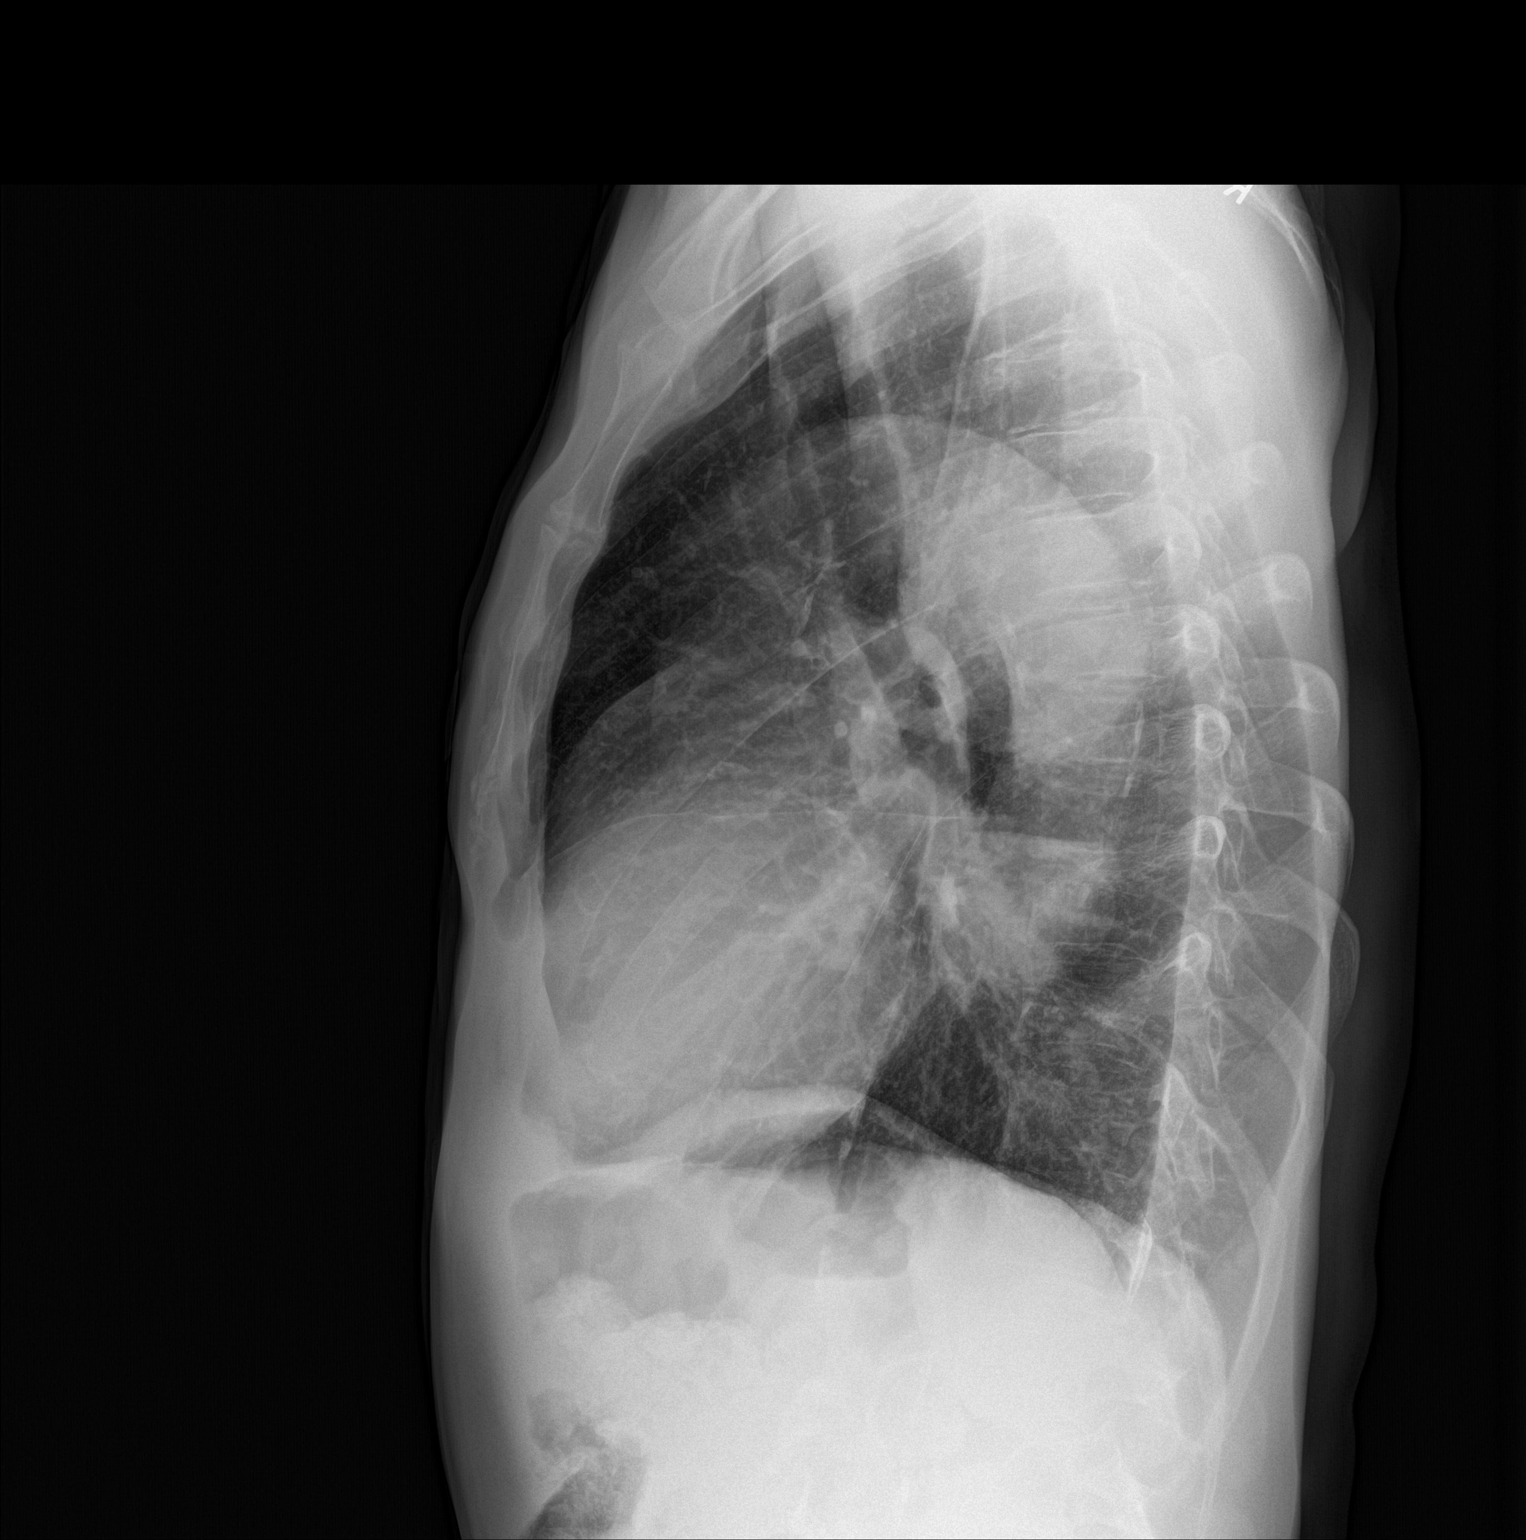

[2 of 2 positions shown; findings below may reference images not displayed]

FINDINGS: There are 2 rounded masses in the right lung, the largest measuring
up to 7 cm.

Chronic cardiomegaly and aortic tortuosity. There is no edema,
consolidation, effusion, or pneumothorax.
IMPRESSION: Right lung masses suggesting a malignant process. Recommend chest CT
with contrast.

## 2016-11-06 IMAGING — CT CT ANGIO CHEST
1 of 2 series · 18 of 30 positions shown · IV contrast (isovue)
Comparison: Chest radiograph performed 08/10/2015

CLINICAL DATA: Acute onset of right-sided chest pain, cough and
right hand pain. Initial encounter.

EXAM:
CT ANGIOGRAPHY CHEST WITH CONTRAST
TECHNIQUE: Multidetector CT imaging of the chest was performed using the
standard protocol during bolus administration of intravenous
contrast. Multiplanar CT image reconstructions and MIPs were
obtained to evaluate the vascular anatomy.
CONTRAST:  75 mL of Isovue 370 IV contrast

[Series 5: pe 1.0 thins · axial · 0.73mm/px · z∈[-552,-272]mm · 18 of 315 slices shown]
[im 18/315  lung]
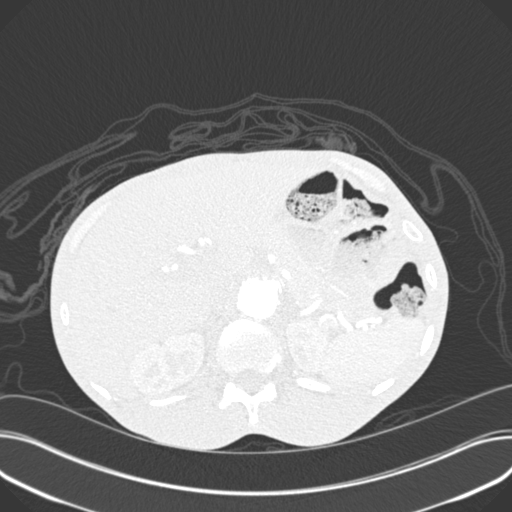
[im 35/315  mediastinal]
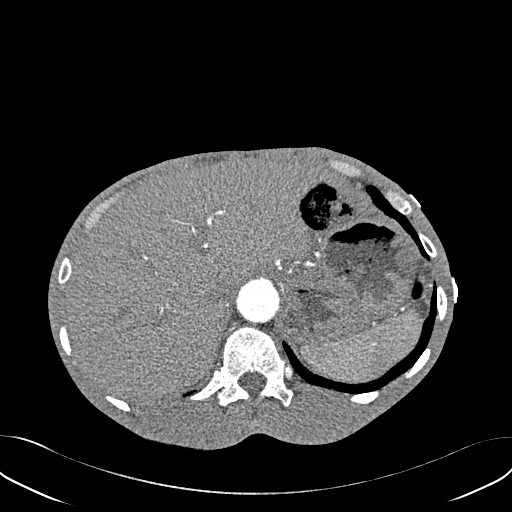
[im 53/315  lung]
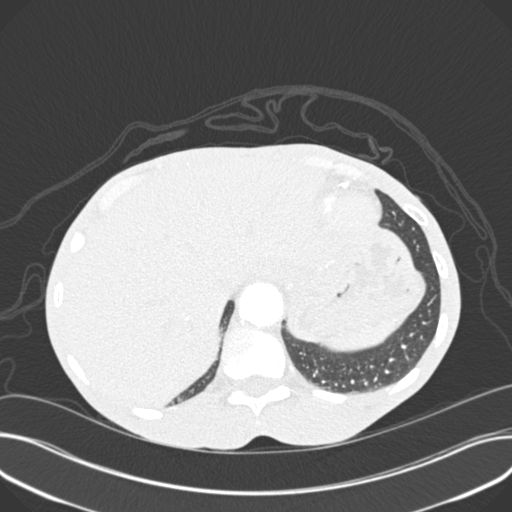
[im 70/315  mediastinal]
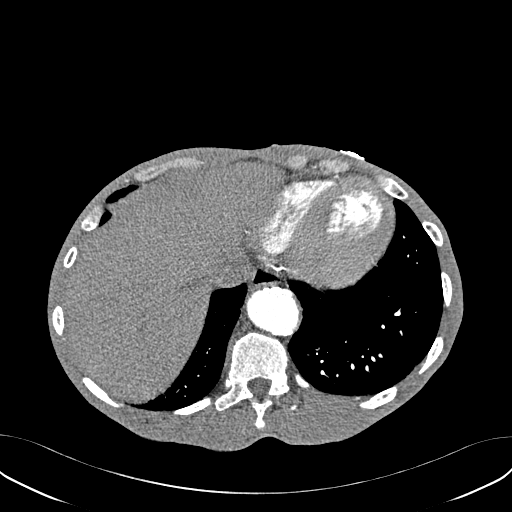
[im 88/315  lung]
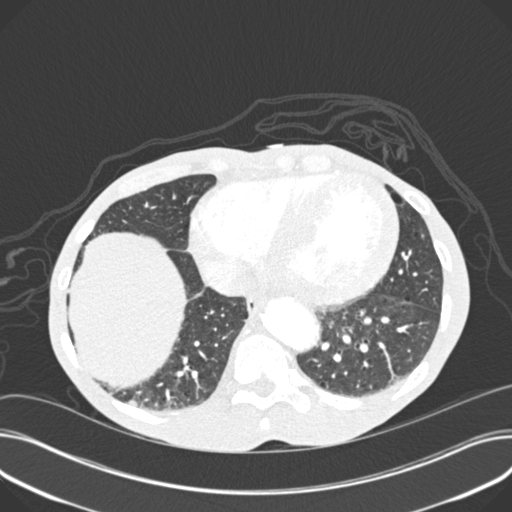
[im 105/315  mediastinal]
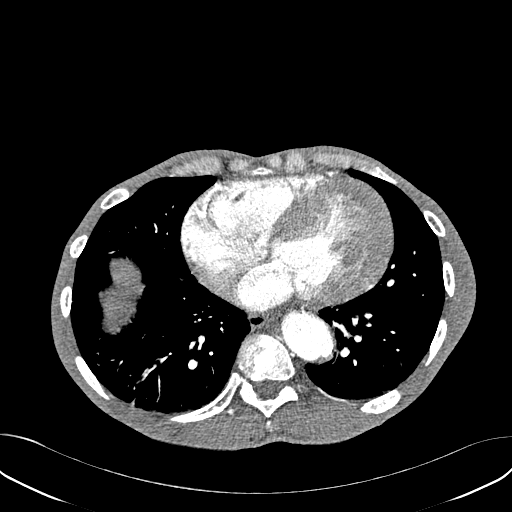
[im 123/315  lung]
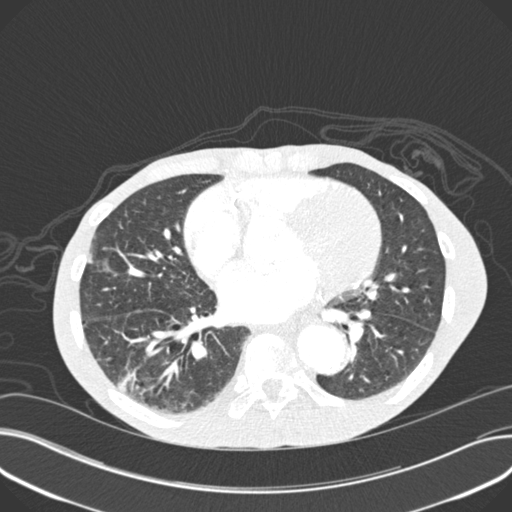
[im 140/315  mediastinal]
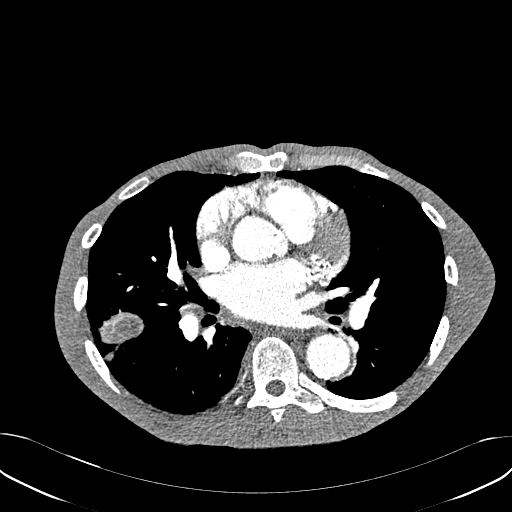
[im 148/315  lung]
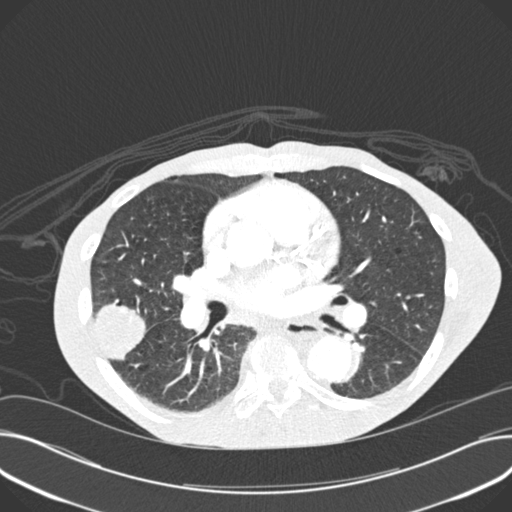
[im 158/315  mediastinal]
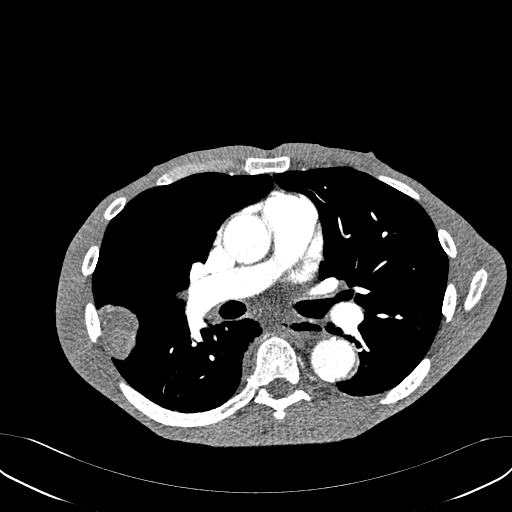
[im 175/315  lung]
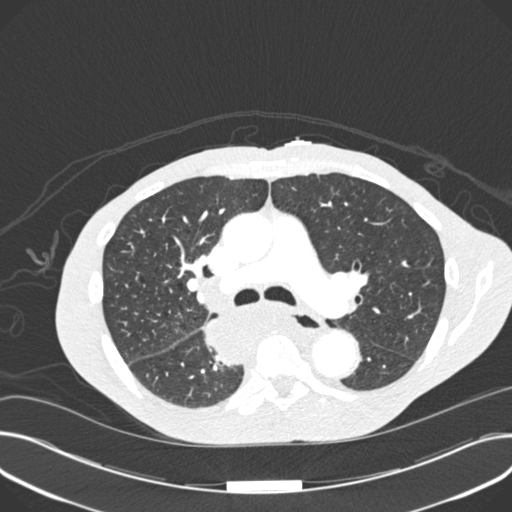
[im 192/315  mediastinal]
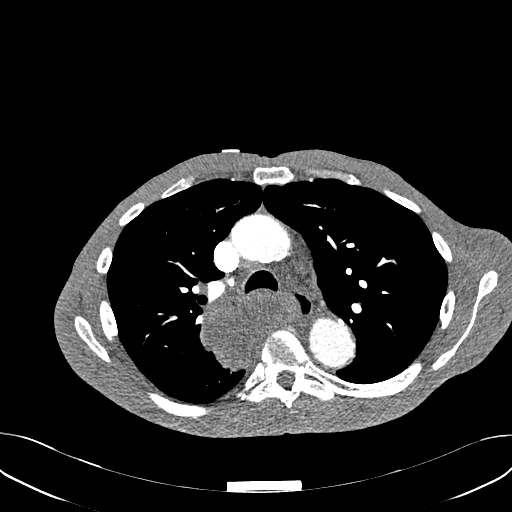
[im 210/315  lung]
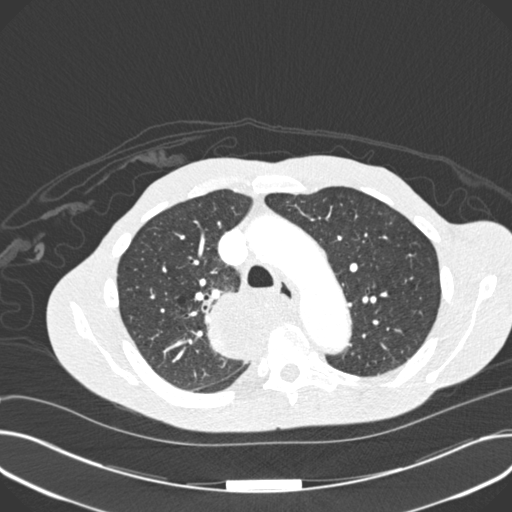
[im 227/315  mediastinal]
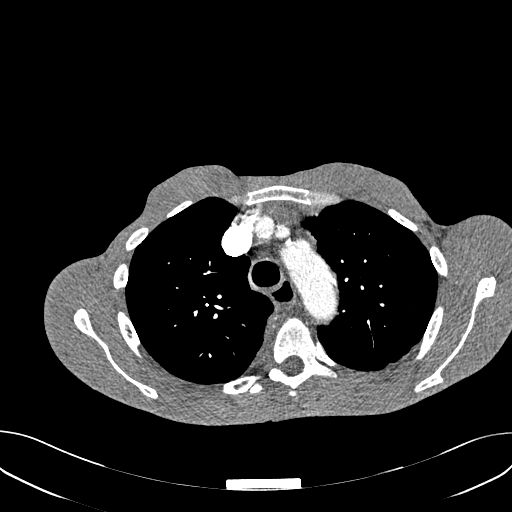
[im 245/315  lung]
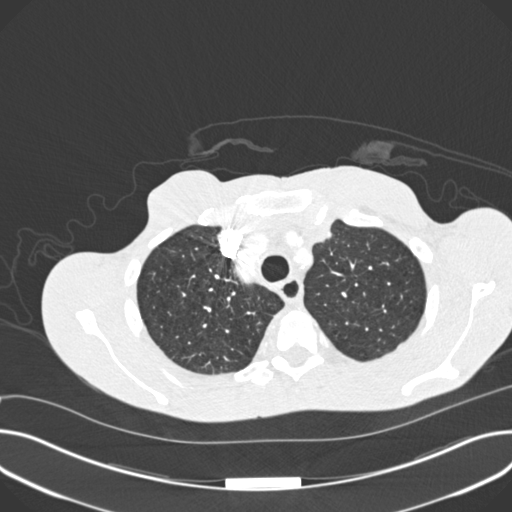
[im 262/315  mediastinal]
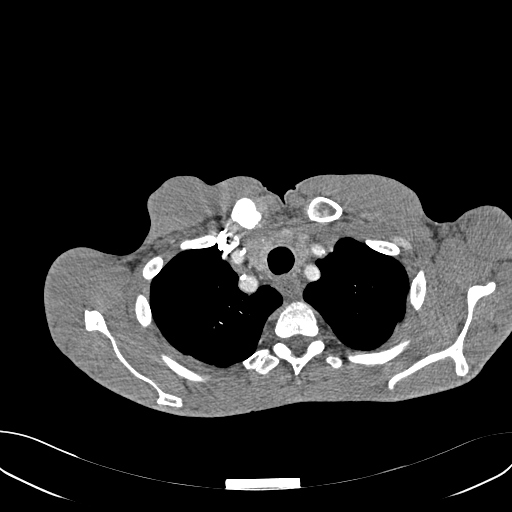
[im 280/315  lung]
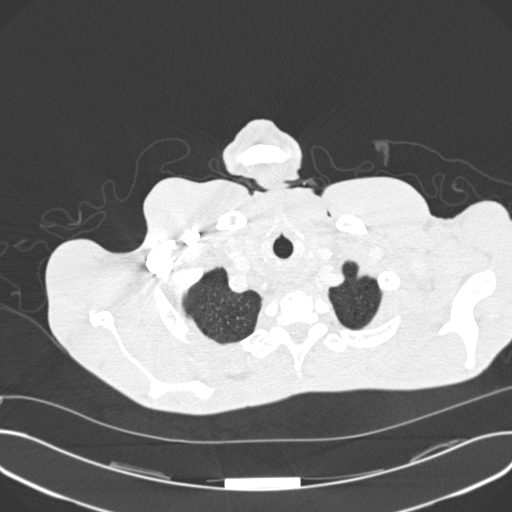
[im 297/315  mediastinal]
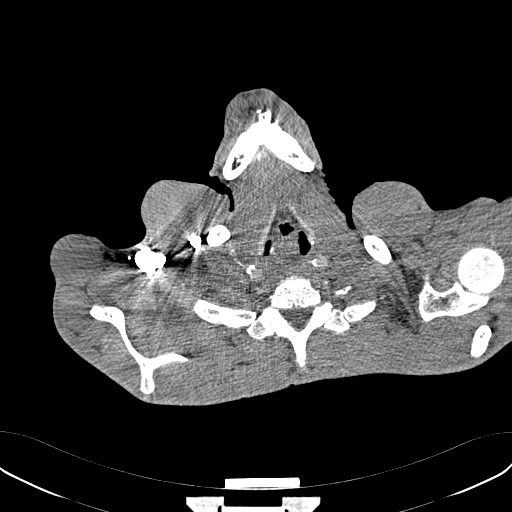

[18 of 30 positions shown; findings below may reference images not displayed]

FINDINGS: There is no evidence of pulmonary embolus.

There is a large 7.7 x 5.5 x 6.3 cm mildly heterogeneous mass
posterior to the distal trachea, partially encasing the right
mainstem bronchus and carina, with mass effect on the branches of
the right main pulmonary artery. This infiltrates vaguely into the
mediastinum. There is also a 4.0 x 3.6 x 3.4 cm mildly heterogeneous
mass at the posterior aspect of the right middle lobe.

A 1.2 cm right hilar node is noted. Smaller right middle lobe
pulmonary nodules are seen, measuring 7 mm, 7 mm and 5 mm in size,
concerning for local spread of disease. There is also a 1.0 cm
nodule at the left lung base. Minimal right basilar nodular
atelectasis is noted.

There is no evidence of significant focal consolidation, pleural
effusion or pneumothorax.

Diffuse coronary artery calcifications are seen. No pericardial
effusion is identified. Scattered calcification is noted along the
aortic arch and great vessels. No axillary lymphadenopathy is seen.
The visualized portions of the thyroid gland are unremarkable in
appearance.

The visualized portions of the liver and spleen are unremarkable.
The visualized portions of the pancreas, gallbladder, stomach,
adrenal glands and kidneys are within normal limits.

There is mild aneurysmal dilatation of the proximal abdominal aorta,
measuring 3.2 cm in AP dimension at the level of the diaphragm.

No acute osseous abnormalities are seen.

Review of the MIP images confirms the above findings.
IMPRESSION: 1. No evidence of pulmonary embolus.
2. Large 7.7 x 5.5 x 6.3 cm mildly heterogeneous mass posterior to
the distal trachea, partially encasing the right mainstem bronchus
and carina, with mild mass-effect on the branches of the right main
pulmonary artery. This infiltrates into the mediastinum. Findings
are concerning for primary bronchogenic malignancy. Given this
appearance, small cell lung cancer is a possibility.
3. 4.0 x 3.6 x 3.4 cm mildly heterogeneous mass at the posterior
aspect of the right middle lobe. Additional smaller right middle
lobe and left basilar pulmonary nodules seen, concerning for
metastatic disease.
4. 1.2 cm right hilar node seen.
5. Diffuse coronary artery calcifications seen.
6. Mild aneurysmal dilatation of the proximal abdominal aorta,
measuring 3.2 cm in AP dimension at the level of the diaphragm.
Recommend followup by ultrasound in 3 years. This recommendation
follows ACR consensus guidelines: White Paper of the ACR Incidental

## 2017-02-18 IMAGING — CR DG CHEST 2V
1 series · 2 of 2 positions shown · non-contrast
Comparison: Chest radiograph performed 08/10/2015, and CTA of the
chest performed 08/11/2015

CLINICAL DATA: Acute onset of nausea, vomiting and generalized
weakness. Initial encounter.

EXAM:
CHEST  2 VIEW

[Series 1: dg chest 2 view · 0.14mm/px · 2 of 2 slices shown]
[im 1/2]
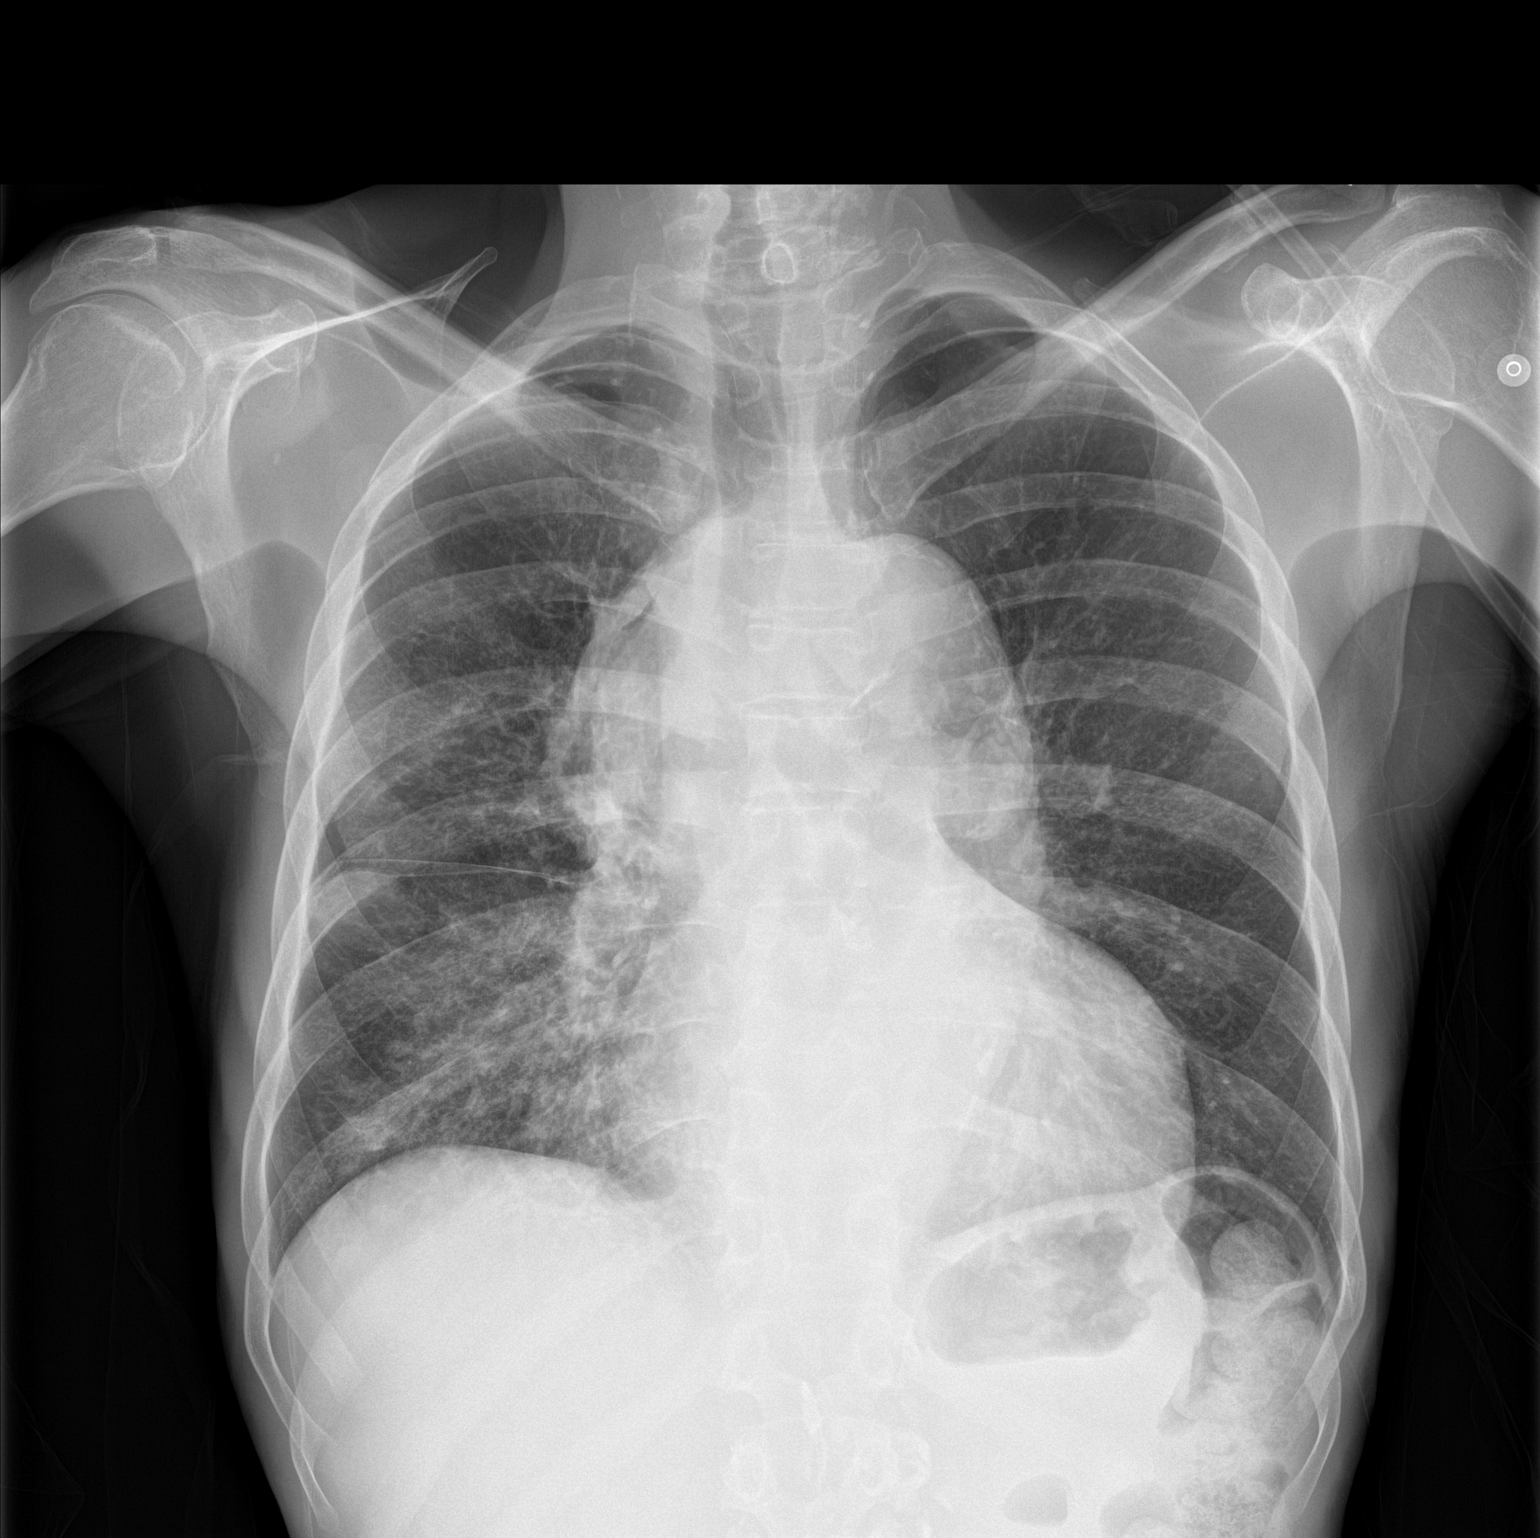
[im 2/2]
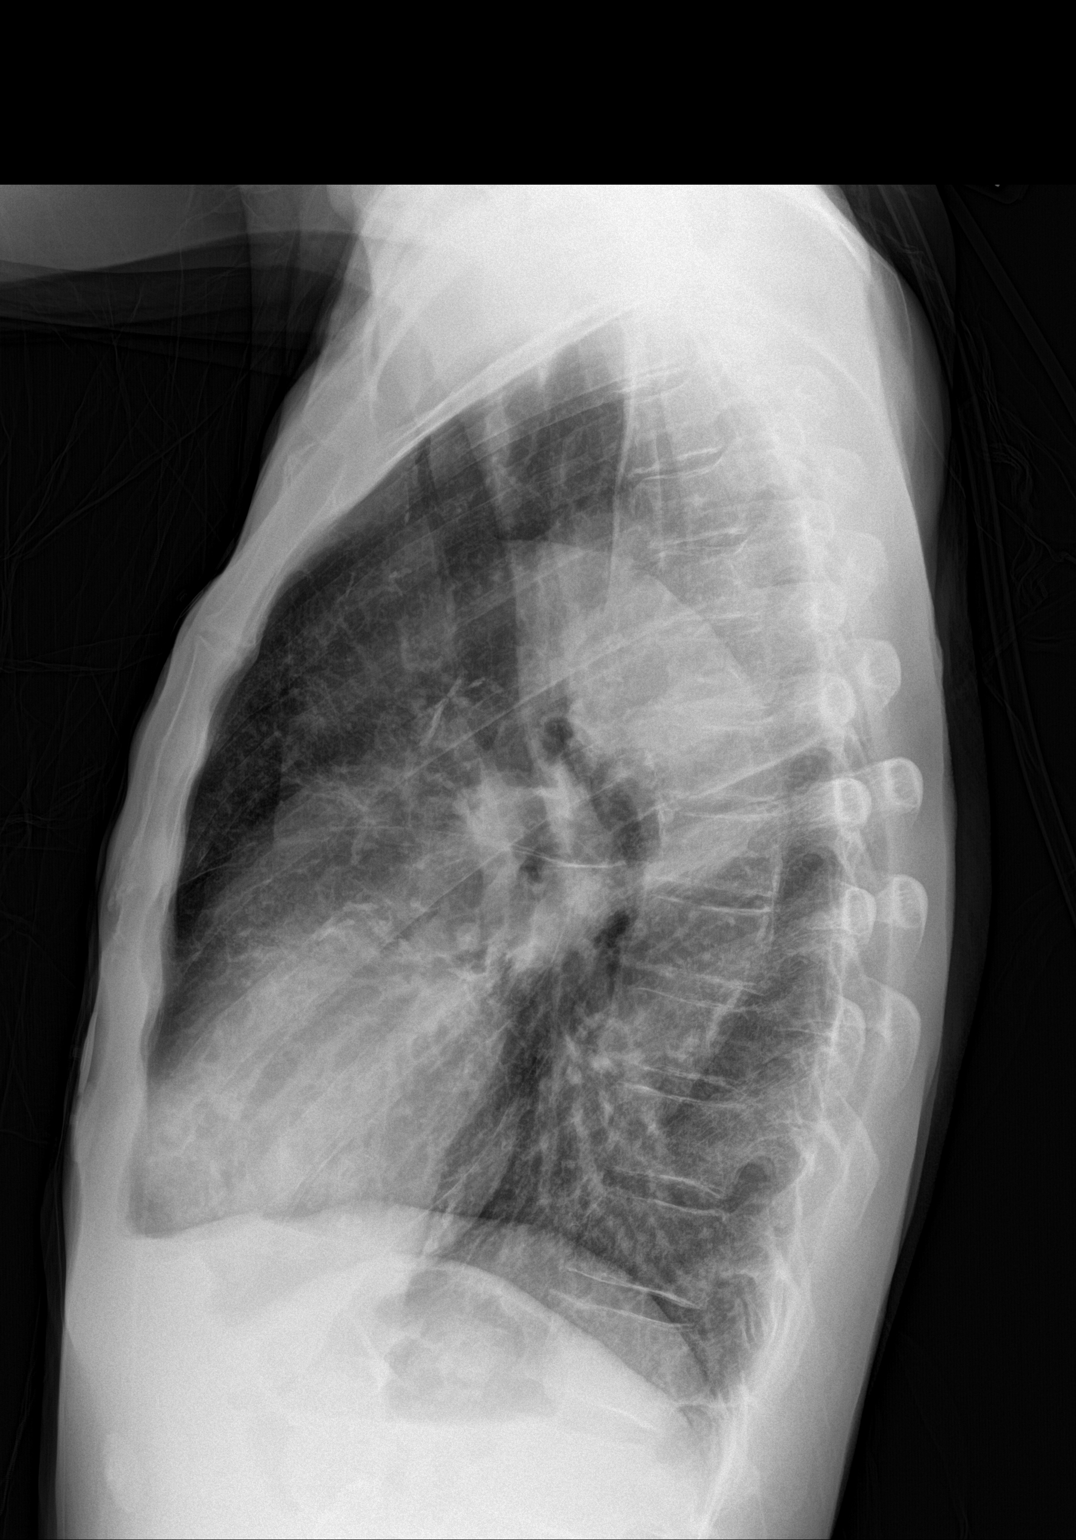

[2 of 2 positions shown; findings below may reference images not displayed]

FINDINGS: The lungs are well-aerated. Patchy right-sided airspace opacity
raises concern for pneumonia. There is no evidence of pleural
effusion or pneumothorax.

The patient's known mediastinal and right lower lobe masses are less
prominent than on the prior studies.

The heart is borderline normal in size. No acute osseous
abnormalities are seen.
IMPRESSION: 1. Patchy right-sided airspace opacity raises concern for pneumonia.
2. Known mediastinal and right lower lobe lung masses are less
prominent than on the prior studies.
